# Patient Record
Sex: Male | Born: 1975
Health system: Southern US, Community
[De-identification: ages and names within clinical notes are randomized; demographics above are authoritative.]

## PROBLEM LIST (undated history)

## (undated) DIAGNOSIS — M8569 Other cyst of bone, multiple sites: Secondary | ICD-10-CM

## (undated) DIAGNOSIS — K921 Melena: Secondary | ICD-10-CM

## (undated) DIAGNOSIS — N200 Calculus of kidney: Secondary | ICD-10-CM

## (undated) DIAGNOSIS — B181 Chronic viral hepatitis B without delta-agent: Principal | ICD-10-CM

## (undated) DIAGNOSIS — N289 Disorder of kidney and ureter, unspecified: Secondary | ICD-10-CM

## (undated) DIAGNOSIS — K2950 Unspecified chronic gastritis without bleeding: Secondary | ICD-10-CM

## (undated) DIAGNOSIS — K5900 Constipation, unspecified: Secondary | ICD-10-CM

## (undated) DIAGNOSIS — R17 Unspecified jaundice: Secondary | ICD-10-CM

## (undated) DIAGNOSIS — K219 Gastro-esophageal reflux disease without esophagitis: Secondary | ICD-10-CM

## (undated) DIAGNOSIS — R809 Proteinuria, unspecified: Secondary | ICD-10-CM

## (undated) DIAGNOSIS — R739 Hyperglycemia, unspecified: Secondary | ICD-10-CM

## (undated) HISTORY — DX: Unspecified chronic gastritis without bleeding: K29.50

## (undated) HISTORY — DX: Disorder of kidney and ureter, unspecified: N28.9

## (undated) HISTORY — DX: Melena: K92.1

## (undated) HISTORY — DX: Unspecified jaundice: R17

## (undated) HISTORY — DX: Chronic viral hepatitis B without delta-agent: B18.1

## (undated) HISTORY — DX: Calculus of kidney: N20.0

## (undated) HISTORY — DX: Proteinuria, unspecified: R80.9

## (undated) HISTORY — DX: Other cyst of bone, multiple sites: M85.69

## (undated) HISTORY — DX: Gastro-esophageal reflux disease without esophagitis: K21.9

## (undated) HISTORY — DX: Constipation, unspecified: K59.00

## (undated) HISTORY — PX: EXTRACORPOREAL SHOCK WAVE LITHOTRIPSY: SHX1557

## (undated) HISTORY — DX: Hyperglycemia, unspecified: R73.9

---

## 2002-02-01 DIAGNOSIS — N200 Calculus of kidney: Secondary | ICD-10-CM

## 2002-02-01 HISTORY — DX: Calculus of kidney: N20.0

## 2010-02-01 DIAGNOSIS — B181 Chronic viral hepatitis B without delta-agent: Secondary | ICD-10-CM

## 2010-02-01 DIAGNOSIS — B1910 Unspecified viral hepatitis B without hepatic coma: Secondary | ICD-10-CM | POA: Insufficient documentation

## 2010-02-01 HISTORY — DX: Chronic viral hepatitis B without delta-agent: B18.1

## 2011-03-16 HISTORY — PX: CIRCUMCISION: SUR203

## 2011-07-19 HISTORY — PX: ESOPHAGOGASTRODUODENOSCOPY: SHX1529

## 2013-02-01 DIAGNOSIS — N289 Disorder of kidney and ureter, unspecified: Secondary | ICD-10-CM

## 2013-02-01 DIAGNOSIS — R809 Proteinuria, unspecified: Secondary | ICD-10-CM

## 2013-02-01 HISTORY — DX: Proteinuria, unspecified: R80.9

## 2013-02-01 HISTORY — DX: Disorder of kidney and ureter, unspecified: N28.9

## 2013-07-20 LAB — LIPID PANEL
Cholesterol: 167 (ref 0–200)
HDL: 52 (ref 35–70)
LDL Cholesterol: 85
Triglycerides: 148 (ref 40–160)

## 2013-07-20 LAB — BASIC METABOLIC PANEL
BUN: 14 (ref 4–21)
Creatinine: 1 (ref <=1.3)
Glucose: 89
Potassium: 4.3 (ref 3.4–5.3)
Sodium: 140 (ref 137–147)

## 2013-07-20 LAB — CBC AND DIFFERENTIAL
HCT: 93 — AB (ref 41–53)
Hemoglobin: 14.1 (ref 13.5–17.5)
Neutrophils Absolute: 3360
Platelets: 267 (ref 150–399)
WBC: 6.5

## 2013-07-20 LAB — HEPATIC FUNCTION PANEL
ALT: 21 (ref 10–40)
AST: 18 (ref 14–40)
Alkaline Phosphatase: 78 (ref 25–125)

## 2013-07-20 LAB — HEMOGLOBIN A1C: Hemoglobin A1C: 5.7

## 2013-08-15 LAB — HEPATIC FUNCTION PANEL
Bilirubin, Direct: 0.2 (ref 0.01–0.4)
Bilirubin, Total: 1.3

## 2016-06-18 ENCOUNTER — Encounter: Payer: Self-pay | Admitting: Family Medicine

## 2016-06-18 ENCOUNTER — Ambulatory Visit (INDEPENDENT_AMBULATORY_CARE_PROVIDER_SITE_OTHER): Payer: BLUE CROSS/BLUE SHIELD | Admitting: Family Medicine

## 2016-06-18 VITALS — BP 104/71 | HR 76 | Temp 98.0°F | Resp 16 | Ht 63.5 in | Wt 153.2 lb

## 2016-06-18 DIAGNOSIS — B9789 Other viral agents as the cause of diseases classified elsewhere: Secondary | ICD-10-CM | POA: Diagnosis not present

## 2016-06-18 DIAGNOSIS — J301 Allergic rhinitis due to pollen: Secondary | ICD-10-CM | POA: Diagnosis not present

## 2016-06-18 DIAGNOSIS — J069 Acute upper respiratory infection, unspecified: Secondary | ICD-10-CM

## 2016-06-18 NOTE — Progress Notes (Signed)
Office Note 06/18/2016  CC:  Chief Complaint  Patient presents with  . Establish Care  . URI    x 2 weeks    HPI:  Kenneth Aguilar is a 41 y.o.  male who is here to establish care and discuss respiratory symptoms. Patient's most recent primary MD: moved to [place] 2 yrs ago and has not established a PCP until now. [facility] [place] [facility]he'll have to get info about the office/MD b/c he doesn't recall right now. Old records were not reviewed prior to or during today's visit.  About 2 wks ago he started having runny nose, sneezing, some dry cough, ST, HA, and mild generalized muscle aches.  He used some otc cough med but this didn't help much.  Sneezing got worse.  No fevers initially.  He thought it was allergic rhinitis so he bought some zyrtec and this didn't help and it made him drowsy.  Then he developed fever to 100.4 after >7d, for one night.  Since then he feels better.  Just some cough remaining.  No hx of seasonal allergic rhinitis until he moved to [place] 6 mo ago--sneezing a lot.    Past Medical History:  Diagnosis Date  . Hepatitis B 2012   Had Hep B vaccine in [place] when in HS.  Pt states labs showed he had hx of + hep B infection in 2012 but it was not active.  No treatment was recommended at that time.  . Kidney stones 2004    Past Surgical History:  Procedure Laterality Date  . EXTRACORPOREAL SHOCK WAVE LITHOTRIPSY      Family History  Problem Relation Age of Onset  . Hypertension Paternal Grandfather   . Stroke Paternal Grandfather     Social History   Social History  . Marital status: Married    Spouse name: N/A  . Number of children: N/A  . Years of education: N/A   Occupational History  . Not on file.   Social History Main Topics  . Smoking status: Former Smoker    Packs/day: 0.50    Years: 15.00    Types: Cigarettes    Quit date: 02/02/2012  . Smokeless tobacco: Never Used  . Alcohol use Yes     Comment: occasionally  . Drug use: No  . Sexual  activity: Not on file   Other Topics Concern  . Not on file   Social History Narrative   Married, 1 daughter and 1 son.   Educ: Ph.D.   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   Tobacco: 10 pack yr hx--quit 2014.   Alcohol: occasional.    Outpatient Encounter Prescriptions as of 06/18/2016  Medication Sig  . cetirizine (ZYRTEC) 10 MG tablet Take 10 mg by mouth daily.   No facility-administered encounter medications on file as of 06/18/2016.     No Known Allergies  ROS Review of Systems  Constitutional: Negative for fatigue and fever.  HENT:       See HPI  Eyes: Negative for visual disturbance.  Respiratory: Positive for cough (see hpi).   Cardiovascular: Negative for chest pain.  Gastrointestinal: Negative for abdominal pain and nausea.  Genitourinary: Negative for dysuria.  Musculoskeletal: Negative for back pain and joint swelling.  Skin: Negative for rash.  Neurological: Negative for weakness and headaches.  Hematological: Negative for adenopathy.    PE; Blood pressure 104/71, pulse 76, temperature 98 F (36.7 C), temperature source Oral, resp. rate 16, height 5' 3.5" (1.613 m),  weight 153 lb 4 oz (69.5 kg), SpO2 96 %. VS: noted--normal. Gen: alert, NAD, NONTOXIC APPEARING. HEENT: eyes without injection, drainage, or swelling.  Ears: EACs clear, TMs with normal light reflex and landmarks.  Nose: Clear rhinorrhea, with some dried, crusty exudate adherent to mildly injected mucosa.  No purulent d/c.  No paranasal sinus TTP.  No facial swelling.  Throat and mouth without focal lesion.  No pharyngial swelling, erythema, or exudate.   Neck: supple, no LAD.   LUNGS: CTA bilat, nonlabored resps.   CV: RRR, no m/r/g. EXT: no c/c/e SKIN: no rash  Pertinent labs:  None today  ASSESSMENT AND PLAN:   New pt: pt to work on getting old records: Pls try to locate the name of your MD or the medical office that you went to in [facility]. Call us with this info  and we'll get your old records.  1) Viral URI with cough: resolving appropriately.  May continue otc cough med prn.  2) Allergic rhinitis--seasonal: recommended trial of otc nasal steroid but he said he'll wait until next time he sees me to possibly get rx for this.  3) Hx of Hep B: history unclear from pt--need old records, plus I'll do some labs relative to this dx at his f/u for CPE in 2 wks.  An After Visit Summary was printed and given to the patient.  Return in about 2 weeks (around 07/02/2016) for annual CPE (fasting).   Signed:  Santiago Bumpers, MD           06/18/2016

## 2016-06-18 NOTE — Patient Instructions (Signed)
Pls try to locate the name of your MD or the medical office that you went [place] [facility] Call us with this info and we'll get your old records.

## 2016-07-01 ENCOUNTER — Encounter: Payer: BLUE CROSS/BLUE SHIELD | Admitting: Family Medicine

## 2016-07-12 ENCOUNTER — Encounter: Payer: Self-pay | Admitting: Family Medicine

## 2016-07-13 NOTE — Telephone Encounter (Signed)
Diane, please request records. Thanks.

## 2016-07-14 ENCOUNTER — Telehealth: Payer: Self-pay | Admitting: *Deleted

## 2016-07-14 ENCOUNTER — Encounter: Payer: Self-pay | Admitting: *Deleted

## 2016-07-14 ENCOUNTER — Encounter: Payer: Self-pay | Admitting: Family Medicine

## 2016-07-14 DIAGNOSIS — R739 Hyperglycemia, unspecified: Secondary | ICD-10-CM | POA: Insufficient documentation

## 2016-07-14 DIAGNOSIS — K2950 Unspecified chronic gastritis without bleeding: Secondary | ICD-10-CM | POA: Insufficient documentation

## 2016-07-14 DIAGNOSIS — K219 Gastro-esophageal reflux disease without esophagitis: Secondary | ICD-10-CM | POA: Insufficient documentation

## 2016-07-14 DIAGNOSIS — R17 Unspecified jaundice: Secondary | ICD-10-CM | POA: Insufficient documentation

## 2016-07-14 DIAGNOSIS — E785 Hyperlipidemia, unspecified: Secondary | ICD-10-CM | POA: Insufficient documentation

## 2016-07-14 NOTE — Telephone Encounter (Signed)
Received complete medical records from [facility] [phone]). Records put on Dr. Samul DadaMcGowen's desk for review.

## 2016-07-15 ENCOUNTER — Encounter: Payer: BLUE CROSS/BLUE SHIELD | Admitting: Family Medicine

## 2016-07-19 ENCOUNTER — Encounter: Payer: Self-pay | Admitting: Family Medicine

## 2016-07-20 NOTE — Telephone Encounter (Signed)
Reviewed records and updated EMR. 

## 2016-08-06 ENCOUNTER — Encounter: Payer: Self-pay | Admitting: *Deleted

## 2016-08-06 ENCOUNTER — Encounter: Payer: Self-pay | Admitting: Family Medicine

## 2016-08-06 ENCOUNTER — Telehealth: Payer: Self-pay | Admitting: *Deleted

## 2016-08-06 NOTE — Telephone Encounter (Signed)
Records from [facility] have been placed on Dr. Samul DadaMcGowen's desk for review.

## 2016-08-08 ENCOUNTER — Encounter: Payer: Self-pay | Admitting: Family Medicine

## 2016-08-18 ENCOUNTER — Other Ambulatory Visit: Payer: Self-pay | Admitting: Family Medicine

## 2016-08-18 ENCOUNTER — Ambulatory Visit (HOSPITAL_BASED_OUTPATIENT_CLINIC_OR_DEPARTMENT_OTHER)
Admission: RE | Admit: 2016-08-18 | Discharge: 2016-08-18 | Disposition: A | Discharge status: Home / Self Care | Payer: BLUE CROSS/BLUE SHIELD | Source: Ambulatory Visit | Attending: Family Medicine | Admitting: Family Medicine

## 2016-08-18 ENCOUNTER — Ambulatory Visit (INDEPENDENT_AMBULATORY_CARE_PROVIDER_SITE_OTHER): Payer: BLUE CROSS/BLUE SHIELD | Admitting: Family Medicine

## 2016-08-18 ENCOUNTER — Encounter: Payer: Self-pay | Admitting: Family Medicine

## 2016-08-18 VITALS — BP 113/73 | HR 66 | Temp 97.9°F | Resp 16 | Ht 63.5 in | Wt 155.0 lb

## 2016-08-18 DIAGNOSIS — Z0000 Encounter for general adult medical examination without abnormal findings: Secondary | ICD-10-CM | POA: Diagnosis not present

## 2016-08-18 DIAGNOSIS — Z87448 Personal history of other diseases of urinary system: Secondary | ICD-10-CM | POA: Diagnosis not present

## 2016-08-18 DIAGNOSIS — K829 Disease of gallbladder, unspecified: Secondary | ICD-10-CM | POA: Insufficient documentation

## 2016-08-18 DIAGNOSIS — B181 Chronic viral hepatitis B without delta-agent: Secondary | ICD-10-CM | POA: Diagnosis not present

## 2016-08-18 DIAGNOSIS — N2889 Other specified disorders of kidney and ureter: Secondary | ICD-10-CM | POA: Diagnosis not present

## 2016-08-18 DIAGNOSIS — Z114 Encounter for screening for human immunodeficiency virus [HIV]: Secondary | ICD-10-CM

## 2016-08-18 DIAGNOSIS — N200 Calculus of kidney: Secondary | ICD-10-CM | POA: Insufficient documentation

## 2016-08-18 LAB — CBC WITH DIFFERENTIAL/PLATELET
Basophils Absolute: 0 10*3/uL (ref 0.0–0.1)
Basophils Relative: 0.7 % (ref 0.0–3.0)
Eosinophils Absolute: 0.1 10*3/uL (ref 0.0–0.7)
Eosinophils Relative: 1.2 % (ref 0.0–5.0)
HCT: 43.1 % (ref 39.0–52.0)
Hemoglobin: 14.2 g/dL (ref 13.0–17.0)
Lymphocytes Relative: 38 % (ref 12.0–46.0)
Lymphs Abs: 2.1 10*3/uL (ref 0.7–4.0)
MCHC: 33 g/dL (ref 30.0–36.0)
MCV: 92.2 fl (ref 78.0–100.0)
Monocytes Absolute: 0.4 10*3/uL (ref 0.1–1.0)
Monocytes Relative: 7.1 % (ref 3.0–12.0)
Neutro Abs: 3 10*3/uL (ref 1.4–7.7)
Neutrophils Relative %: 53 % (ref 43.0–77.0)
Platelets: 277 10*3/uL (ref 150.0–400.0)
RBC: 4.67 Mil/uL (ref 4.22–5.81)
RDW: 13.9 % (ref 11.5–15.5)
WBC: 5.6 10*3/uL (ref 4.0–10.5)

## 2016-08-18 LAB — TSH: TSH: 1.35 u[IU]/mL (ref 0.35–4.50)

## 2016-08-18 LAB — LIPID PANEL
Cholesterol: 156 mg/dL (ref 0–200)
HDL: 58.5 mg/dL (ref >39.00)
LDL Cholesterol: 77 mg/dL (ref 0–99)
NonHDL: 97.09
Total CHOL/HDL Ratio: 3
Triglycerides: 99 mg/dL (ref 0.0–149.0)
VLDL: 19.8 mg/dL (ref 0.0–40.0)

## 2016-08-18 LAB — COMPREHENSIVE METABOLIC PANEL
ALT: 22 U/L (ref 0–53)
AST: 19 U/L (ref 0–37)
Albumin: 4.5 g/dL (ref 3.5–5.2)
Alkaline Phosphatase: 108 U/L (ref 39–117)
BUN: 10 mg/dL (ref 6–23)
CO2: 30 mEq/L (ref 19–32)
Calcium: 9.5 mg/dL (ref 8.4–10.5)
Chloride: 104 mEq/L (ref 96–112)
Creatinine, Ser: 0.9 mg/dL (ref 0.40–1.50)
GFR: 99.5 mL/min (ref >60.00)
Glucose, Bld: 90 mg/dL (ref 70–99)
Potassium: 4.1 mEq/L (ref 3.5–5.1)
Sodium: 139 mEq/L (ref 135–145)
Total Bilirubin: 1 mg/dL (ref 0.2–1.2)
Total Protein: 7.1 g/dL (ref 6.0–8.3)

## 2016-08-18 LAB — MICROALBUMIN / CREATININE URINE RATIO
Creatinine,U: 106.3 mg/dL
Microalb Creat Ratio: 18.8 mg/g (ref 0.0–30.0)
Microalb, Ur: 20 mg/dL — ABNORMAL HIGH (ref 0.0–1.9)

## 2016-08-18 LAB — GAMMA GT: GGT: 28 U/L (ref 7–51)

## 2016-08-18 NOTE — Patient Instructions (Signed)

## 2016-08-18 NOTE — Progress Notes (Signed)
Office Note 08/18/2016  CC:  Chief Complaint  Patient presents with  . Annual Exam    Pt is fasting.     HPI:  Kenneth Aguilar is a 41 y.o. Asian male who is here for annual health maintenance exam.  Eye exam and dental preventatives not UTD. Exercise: none Diet: not working on anything in particular--admits to eating lots of carbs.  Hx of Hep B infection.  As of 2015 labs at previous PCPs office, his labs showed Hep B carrier status, normal liver on u/s at that time.  He denies any abd pain, fatigue, jaundice, lack of appetite, abd fullness, LE swelling, or n/v/d.  Pt also notes past hx of proteinuria but I can't find record of this in his prior PCP records. Pt states he was told to go to a nephrologist but says he never did this.  Past Medical History:  Diagnosis Date  . Black stool   . Chronic gastritis    Hx of + H. pylori  . Constipation   . Cyst, bone local   . GERD (gastroesophageal reflux disease)   . Hepatitis B carrier [facility]) 2012   Labs showed he had hx of + hep B infection in 2015 but it was not active.  Imaging showed no liver abnormality.  No treatment was recommended at that time (review of old records shows dx of Hep B carrier))  . Hyperglycemia   . Jaundice   . Kidney stones 2004  . Lesion of right native kidney 08/2013   Very small angiomyolipoma (5 mm)  . Nephrolithiasis   . Protein in urine 2015   nephrologist eval recommended by prior PCP  . Total bilirubin, elevated    08/21/13 abd u/s normal.    Past Surgical History:  Procedure Laterality Date  . CIRCUMCISION  03/16/2011   Dx reason: Phimosis  . ESOPHAGOGASTRODUODENOSCOPY  07/19/2011  . EXTRACORPOREAL SHOCK WAVE LITHOTRIPSY      Family History  Problem Relation Age of Onset  . Hypertension Paternal Grandfather   . Stroke Paternal Grandfather     Social History   Social History  . Marital status: Married    Spouse name: N/A  . Number of children: N/A  . Years of education: N/A    Occupational History  . Not on file.   Social History Main Topics  . Smoking status: Former Smoker    Packs/day: 0.50    Years: 15.00    Types: Cigarettes    Quit date: 02/02/2012  . Smokeless tobacco: Never Used  . Alcohol use Yes     Comment: occasionally  . Drug use: No  . Sexual activity: Not on file   Other Topics Concern  . Not on file   Social History Narrative   Married, 1 daughter and 1 son.   Educ: Ph.D.   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   Tobacco: 10 pack yr hx--quit 2014.   Alcohol: occasional.    Outpatient Medications Prior to Visit  Medication Sig Dispense Refill  . cetirizine (ZYRTEC) 10 MG tablet Take 10 mg by mouth daily.     No facility-administered medications prior to visit.     Allergies  Allergen Reactions  . Tdap [Diphth-Acell Pertussis-Tetanus] Other (See Comments)    fever    ROS Review of Systems  Constitutional: Negative for appetite change, chills, fatigue and fever.  HENT: Negative for congestion, dental problem, ear pain and sore throat.   Eyes: Negative for discharge, redness and visual disturbance.  Respiratory: Negative for cough, chest tightness, shortness of breath and wheezing.   Cardiovascular: Negative for chest pain, palpitations and leg swelling.  Gastrointestinal: Negative for abdominal pain, blood in stool, diarrhea, nausea and vomiting.  Genitourinary: Negative for difficulty urinating, dysuria, flank pain, frequency, hematuria and urgency.  Musculoskeletal: Negative for arthralgias, back pain, joint swelling, myalgias and neck stiffness.  Skin: Negative for pallor and rash.  Neurological: Negative for dizziness, speech difficulty, weakness and headaches.  Hematological: Negative for adenopathy. Does not bruise/bleed easily.  Psychiatric/Behavioral: Negative for confusion and sleep disturbance. The patient is not nervous/anxious.     PE; Blood pressure 113/73, pulse 66, temperature 97.9 F (36.6  C), temperature source Oral, resp. rate 16, height 5' 3.5" (1.613 m), weight 155 lb (70.3 kg), SpO2 96 %. Body mass index is 27.03 kg/m.  Gen: Alert, well appearing.  Patient is oriented to person, place, time, and situation. AFFECT: pleasant, lucid thought and speech. ENT: Ears: EACs clear, normal epithelium.  TMs with good light reflex and landmarks bilaterally.  Eyes: no injection, icteris, swelling, or exudate.  EOMI, PERRLA. Nose: no drainage or turbinate edema/swelling.  No injection or focal lesion.  Mouth: lips without lesion/swelling.  Oral mucosa pink and moist.  Dentition intact and without obvious caries or gingival swelling.  Oropharynx without erythema, exudate, or swelling.  Neck: supple/nontender.  No LAD, mass, or TM.   CV: RRR, no m/r/g.   LUNGS: CTA bilat, nonlabored resps, good aeration in all lung fields. ABD: soft, NT, ND, BS normal.  No hepatospenomegaly or mass.  No bruits. EXT: no clubbing, cyanosis, or edema.  Musculoskeletal: no joint swelling, erythema, warmth, or tenderness.  ROM of all joints intact. Skin - no sores or suspicious lesions or rashes or color changes   Pertinent labs:   Lab Results  Component Value Date   WBC 6.5 07/20/2013   HGB 14.1 07/20/2013   HCT 93 (A) 07/20/2013   PLT 267 07/20/2013   Lab Results  Component Value Date   CREATININE 1.0 07/20/2013   BUN 14 07/20/2013   NA 140 07/20/2013   K 4.3 07/20/2013   Lab Results  Component Value Date   ALT 21 07/20/2013   AST 18 07/20/2013   ALKPHOS 78 07/20/2013   Lab Results  Component Value Date   CHOL 167 07/20/2013   Lab Results  Component Value Date   HDL 52 07/20/2013   Lab Results  Component Value Date   LDLCALC 85 07/20/2013   Lab Results  Component Value Date   TRIG 148 07/20/2013   Lab Results  Component Value Date   HGBA1C 5.7 07/20/2013    ASSESSMENT AND PLAN:   1) Hep B carrier: recheck lab panel for this condition as well as abdominal ultrasound. May  want to consider referral to hepatology clinic in [place].  2) Questionable hx of proteinuria syndrome: renal function has been normal. Will check urine microalb/cr today.  3) Health maintenance exam: Reviewed age and gender appropriate health maintenance issues (prudent diet, regular exercise, health risks of tobacco and excessive alcohol, use of seatbelts, fire alarms in home, use of sunscreen).  Also reviewed age and gender appropriate health screening as well as vaccine recommendations. Vaccines UTD. Fasting HP labs today.  An After Visit Summary was printed and given to the patient.  FOLLOW UP:  Return in about 1 year (around 08/18/2017) for annual CPE (fasting).  Signed:  Santiago Bumpers, MD  08/18/2016  

## 2016-08-19 ENCOUNTER — Encounter: Payer: Self-pay | Admitting: Family Medicine

## 2016-08-19 LAB — HEPATITIS B SURF AG CONFIRMATION: Hepatitis B Surf Ag Confirmation: POSITIVE — AB

## 2016-08-19 LAB — HEPATITIS B SURFACE ANTIBODY, QUANTITATIVE: Hepatitis B-Post: 7 m[IU]/mL — ABNORMAL LOW (ref >=10)

## 2016-08-19 LAB — HEPATITIS B SURFACE ANTIGEN: Hepatitis B Surface Ag: POSITIVE — AB

## 2016-08-19 LAB — HEPATITIS C ANTIBODY: HCV Ab: NEGATIVE

## 2016-08-19 LAB — HIV ANTIBODY (ROUTINE TESTING W REFLEX): HIV 1&2 Ab, 4th Generation: NONREACTIVE

## 2016-08-20 LAB — HEPATITIS B E ANTIGEN: Hepatitis Be Antigen: NONREACTIVE

## 2016-08-20 LAB — HEPATITIS B E ANTIBODY: Hepatitis Be Antibody: REACTIVE — AB

## 2016-08-20 LAB — HEPATITIS B CORE AB W/REFLEX: Hep B Core Total Ab: POSITIVE — AB

## 2016-08-20 LAB — HBCIGM: Hep B C IgM: NEGATIVE

## 2016-08-23 ENCOUNTER — Telehealth: Payer: Self-pay | Admitting: *Deleted

## 2016-08-23 NOTE — Telephone Encounter (Signed)
Pt called stating that Dr. Milinda CaveMcGowen left a vm for him to call back.

## 2016-08-24 ENCOUNTER — Encounter: Payer: Self-pay | Admitting: Family Medicine

## 2016-08-27 ENCOUNTER — Encounter: Payer: Self-pay | Admitting: *Deleted

## 2016-08-27 LAB — HEPATITIS B DNA, ULTRAQUANTITATIVE, PCR
Hepatitis B DNA (Calc): 1.68 Log IU/mL — ABNORMAL HIGH
Hepatitis B DNA: 47 IU/mL — ABNORMAL HIGH

## 2016-09-23 ENCOUNTER — Encounter: Payer: Self-pay | Admitting: Family Medicine

## 2017-11-17 ENCOUNTER — Ambulatory Visit (INDEPENDENT_AMBULATORY_CARE_PROVIDER_SITE_OTHER): Payer: BLUE CROSS/BLUE SHIELD | Admitting: Family Medicine

## 2017-11-17 ENCOUNTER — Encounter: Payer: Self-pay | Admitting: Family Medicine

## 2017-11-17 VITALS — BP 128/79 | HR 87 | Temp 98.7°F | Resp 16 | Ht 64.0 in | Wt 158.4 lb

## 2017-11-17 DIAGNOSIS — Z0000 Encounter for general adult medical examination without abnormal findings: Secondary | ICD-10-CM

## 2017-11-17 DIAGNOSIS — L989 Disorder of the skin and subcutaneous tissue, unspecified: Secondary | ICD-10-CM | POA: Diagnosis not present

## 2017-11-17 DIAGNOSIS — N2889 Other specified disorders of kidney and ureter: Secondary | ICD-10-CM | POA: Diagnosis not present

## 2017-11-17 DIAGNOSIS — B181 Chronic viral hepatitis B without delta-agent: Secondary | ICD-10-CM | POA: Diagnosis not present

## 2017-11-17 DIAGNOSIS — E663 Overweight: Secondary | ICD-10-CM

## 2017-11-17 NOTE — Patient Instructions (Signed)

## 2017-11-17 NOTE — Progress Notes (Signed)
Office Note 11/17/2017  CC:  Chief Complaint  Patient presents with  . Annual Exam    Pt is not fasting.   . Skin Lesion    upper left arm    HPI:  Kenneth Aguilar is a 42 y.o. Asian male who is here for annual health maintenance exam.  Says he is feeling well.  His only acute complaint is a skin lesion on left upper arm for the last few months.  It has grown, seems to have been irritated and bled easily.  Now it is pinkish and flaky.  No pain or itching.  He is a hep b carrier.  Due to poor communication, we never got him in with a hepatologist or ID specialist after I saw him last summer.  He is agreeable to this referral today.  Will also do rechecking of Hep B surface antigen and hep B DNA quant and LFTs. Also, we discussed the small right kidney lesion noted on abd u/s last year.  We had discussed doing the MRI abd that was recommended as f/u imaging to clarify if it is a angiomyolipoma, but it seems he was lost to f/u regarding this. He is open to scheduling this now.  Exercise: minimal Diet: "too much carbohydrates"    Past Medical History:  Diagnosis Date  . Black stool   . Chronic gastritis    Hx of + H. pylori  . Constipation   . Cyst, bone local   . GERD (gastroesophageal reflux disease)   . Hepatitis B carrier [facility]) 2012   Labs showed he had hx of + hep B infection in 2015 but it was not active.  Abd u/s showed no liver abnormality.  No treatment was recommended at that time (review of old records shows dx of Hep B carrier))  . Hyperglycemia   . Jaundice   . Kidney stones 2004  . Lesion of right native kidney 08/2013   U/S abd showed very small angiomyolipoma (5 mm) vs nonshadowing stone.  No hydronephrosis bilat.  Liver normal.  Rpt u/s 08/2016, same lesion measured 7 mm, MRI abd recommended by radiologist.  . Nephrolithiasis   . Protein in urine 2015   nephrologist eval recommended by prior PCP  . Total bilirubin, elevated    08/21/13 abd u/s normal.    Past  Surgical History:  Procedure Laterality Date  . CIRCUMCISION  03/16/2011   Dx reason: Phimosis  . ESOPHAGOGASTRODUODENOSCOPY  07/19/2011   Gastric bx: severe chronic gastritis, H pylori NEG  . EXTRACORPOREAL SHOCK WAVE LITHOTRIPSY      Family History  Problem Relation Age of Onset  . Hypertension Paternal Grandfather   . Stroke Paternal Grandfather     Social History   Socioeconomic History  . Marital status: Married    Spouse name: Not on file  . Number of children: Not on file  . Years of education: Not on file  . Highest education level: Not on file  Occupational History  . Not on file  Social Needs  . Financial resource strain: Not on file  . Food insecurity:    Worry: Not on file    Inability: Not on file  . Transportation needs:    Medical: Not on file    Non-medical: Not on file  Tobacco Use  . Smoking status: Former Smoker    Packs/day: 0.50    Years: 15.00    Pack years: 7.50    Types: Cigarettes    Last attempt to quit:  02/02/2012    Years since quitting: 5.7  . Smokeless tobacco: Never Used  Substance and Sexual Activity  . Alcohol use: Yes    Comment: occasionally  . Drug use: No  . Sexual activity: Not on file  Lifestyle  . Physical activity:    Days per week: Not on file    Minutes per session: Not on file  . Stress: Not on file  Relationships  . Social connections:    Talks on phone: Not on file    Gets together: Not on file    Attends religious service: Not on file    Active member of club or organization: Not on file    Attends meetings of clubs or organizations: Not on file    Relationship status: Not on file  . Intimate partner violence:    Fear of current or ex partner: Not on file    Emotionally abused: Not on file    Physically abused: Not on file    Forced sexual activity: Not on file  Other Topics Concern  . Not on file  Social History Narrative   Married, 1 daughter and 1 son.   Educ: Ph.D.   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   Tobacco: 10 pack yr hx--quit 2014.   Alcohol: occasional.    No outpatient medications prior to visit.   No facility-administered medications prior to visit.     Allergies  Allergen Reactions  . Tdap [Tetanus-Diphth-Acell Pertussis] Other (See Comments)    fever    ROS Review of Systems  Constitutional: Negative for appetite change, chills, fatigue and fever.  HENT: Negative for congestion, dental problem, ear pain and sore throat.   Eyes: Negative for discharge, redness and visual disturbance.  Respiratory: Negative for cough, chest tightness, shortness of breath and wheezing.   Cardiovascular: Negative for chest pain, palpitations and leg swelling.  Gastrointestinal: Negative for abdominal pain, blood in stool, diarrhea, nausea and vomiting.  Genitourinary: Negative for difficulty urinating, dysuria, flank pain, frequency, hematuria and urgency.  Musculoskeletal: Negative for arthralgias, back pain, joint swelling, myalgias and neck stiffness.  Skin: Negative for pallor and rash.       L upper arm lesion as noted in HPI  Neurological: Negative for dizziness, speech difficulty, weakness and headaches.  Hematological: Negative for adenopathy. Does not bruise/bleed easily.  Psychiatric/Behavioral: Negative for confusion and sleep disturbance. The patient is not nervous/anxious.     PE; Blood pressure 128/79, pulse 87, temperature 98.7 F (37.1 C), temperature source Oral, resp. rate 16, height 5\' 4"  (1.626 m), weight 158 lb 6 oz (71.8 kg), SpO2 96 %. Gen: Alert, well appearing.  Patient is oriented to person, place, time, and situation. AFFECT: pleasant, lucid thought and speech. ENT: Ears: EACs clear, normal epithelium.  TMs with good light reflex and landmarks bilaterally.  Eyes: no injection, icteris, swelling, or exudate.  EOMI, PERRLA. Nose: no drainage or turbinate edema/swelling.  No injection or focal lesion.  Mouth: lips without lesion/swelling.   Oral mucosa pink and moist.  Dentition intact and without obvious caries or gingival swelling.  Oropharynx without erythema, exudate, or swelling.  Neck: supple/nontender.  No LAD, mass, or TM.  Carotid pulses 2+ bilaterally, without bruits. CV: RRR, no m/r/g.   LUNGS: CTA bilat, nonlabored resps, good aeration in all lung fields. ABD: soft, NT, ND, BS normal.  No hepatospenomegaly or mass.  No bruits. EXT: no clubbing, cyanosis, or edema.  Musculoskeletal: no joint swelling, erythema, warmth, or tenderness.  ROM of all joints intact. Skin - no sores or color changes.  No jaundice.  Left upper arm extensor surface with 1 cm round, pink nodule with superficial desquamation and a bit of abrasion medially.  No warty appearance to the surface of the lesion, but it does appear irritated. No brown/dark pigment at all.   Pertinent labs:  Lab Results  Component Value Date   TSH 1.35 08/18/2016   Lab Results  Component Value Date   WBC 5.6 08/18/2016   HGB 14.2 08/18/2016   HCT 43.1 08/18/2016   MCV 92.2 08/18/2016   PLT 277.0 08/18/2016   Lab Results  Component Value Date   CREATININE 0.90 08/18/2016   BUN 10 08/18/2016   NA 139 08/18/2016   K 4.1 08/18/2016   CL 104 08/18/2016   CO2 30 08/18/2016   Lab Results  Component Value Date   ALT 22 08/18/2016   AST 19 08/18/2016   ALKPHOS 108 08/18/2016   BILITOT 1.0 08/18/2016   Lab Results  Component Value Date   CHOL 156 08/18/2016   Lab Results  Component Value Date   HDL 58.50 08/18/2016   Lab Results  Component Value Date   LDLCALC 77 08/18/2016   Lab Results  Component Value Date   TRIG 99.0 08/18/2016   Lab Results  Component Value Date   CHOLHDL 3 08/18/2016   Lab Results  Component Value Date   HGBA1C 5.7 07/20/2013    ASSESSMENT AND PLAN:   1) Health maintenance exam: Reviewed age and gender appropriate health maintenance issues (prudent diet, regular exercise, health risks of tobacco and excessive  alcohol, use of seatbelts, fire alarms in home, use of sunscreen).  Also reviewed age and gender appropriate health screening as well as vaccine recommendations. Vaccines: he is UTD, including this season's flu vaccine. Labs: future fasting HP labs ordered, as well as Hep B DNA pcr-quant and Hep B surface antigen recheck. Prostate ca screening: average risk patient= as per latest guidelines, start screening at 45-50 yrs of age. Colon ca screening: average risk patient= as per latest guidelines, start screening at 45-50 yrs of age.  2) Hep B carrier: refer to ID clinic. Recheck Hep B DNA PCR qualitative and Hep B surface antigen, + LFTs.  3) Right renal lesion: noted on abd u/s 1 yr ago. Ordered MRI abd with contrast to f/u as per radiologist's recommendations.  4) Left upper arm skin lesion: ? Irritated verruca?  Pt will schedule procedure visit with me and I'll do shave excision and send to path.  An After Visit Summary was printed and given to the patient.  FOLLOW UP:  Return for Appt at pt's convenience for procedure (skin lesion excision); also, fasting lab appt  ASAP.  Signed:  Santiago Bumpers, MD           11/17/2017

## 2017-11-21 ENCOUNTER — Encounter: Payer: BLUE CROSS/BLUE SHIELD | Admitting: Family Medicine

## 2017-11-21 ENCOUNTER — Other Ambulatory Visit (INDEPENDENT_AMBULATORY_CARE_PROVIDER_SITE_OTHER): Payer: BLUE CROSS/BLUE SHIELD

## 2017-11-21 DIAGNOSIS — Z0000 Encounter for general adult medical examination without abnormal findings: Secondary | ICD-10-CM

## 2017-11-21 DIAGNOSIS — B181 Chronic viral hepatitis B without delta-agent: Secondary | ICD-10-CM

## 2017-11-21 DIAGNOSIS — E663 Overweight: Secondary | ICD-10-CM | POA: Diagnosis not present

## 2017-11-21 LAB — COMPREHENSIVE METABOLIC PANEL
ALT: 25 U/L (ref 0–53)
AST: 19 U/L (ref 0–37)
Albumin: 4.5 g/dL (ref 3.5–5.2)
Alkaline Phosphatase: 83 U/L (ref 39–117)
BUN: 21 mg/dL (ref 6–23)
CO2: 27 mEq/L (ref 19–32)
Calcium: 9.1 mg/dL (ref 8.4–10.5)
Chloride: 106 mEq/L (ref 96–112)
Creatinine, Ser: 0.89 mg/dL (ref 0.40–1.50)
GFR: 100.16 mL/min (ref >60.00)
Glucose, Bld: 96 mg/dL (ref 70–99)
Potassium: 4.2 mEq/L (ref 3.5–5.1)
Sodium: 140 mEq/L (ref 135–145)
Total Bilirubin: 1 mg/dL (ref 0.2–1.2)
Total Protein: 7.2 g/dL (ref 6.0–8.3)

## 2017-11-21 LAB — CBC WITH DIFFERENTIAL/PLATELET
Basophils Absolute: 0 10*3/uL (ref 0.0–0.1)
Basophils Relative: 0.7 % (ref 0.0–3.0)
Eosinophils Absolute: 0.1 10*3/uL (ref 0.0–0.7)
Eosinophils Relative: 2.3 % (ref 0.0–5.0)
HCT: 43.2 % (ref 39.0–52.0)
Hemoglobin: 14.6 g/dL (ref 13.0–17.0)
Lymphocytes Relative: 33.7 % (ref 12.0–46.0)
Lymphs Abs: 2.1 10*3/uL (ref 0.7–4.0)
MCHC: 33.8 g/dL (ref 30.0–36.0)
MCV: 91.2 fl (ref 78.0–100.0)
Monocytes Absolute: 0.3 10*3/uL (ref 0.1–1.0)
Monocytes Relative: 5.3 % (ref 3.0–12.0)
Neutro Abs: 3.7 10*3/uL (ref 1.4–7.7)
Neutrophils Relative %: 58 % (ref 43.0–77.0)
Platelets: 288 10*3/uL (ref 150.0–400.0)
RBC: 4.74 Mil/uL (ref 4.22–5.81)
RDW: 13.1 % (ref 11.5–15.5)
WBC: 6.4 10*3/uL (ref 4.0–10.5)

## 2017-11-21 LAB — LIPID PANEL
Cholesterol: 197 mg/dL (ref 0–200)
HDL: 55.5 mg/dL (ref >39.00)
LDL Cholesterol: 125 mg/dL — ABNORMAL HIGH (ref 0–99)
NonHDL: 141.8
Total CHOL/HDL Ratio: 4
Triglycerides: 82 mg/dL (ref 0.0–149.0)
VLDL: 16.4 mg/dL (ref 0.0–40.0)

## 2017-11-21 LAB — TSH: TSH: 1.3 u[IU]/mL (ref 0.35–4.50)

## 2017-11-23 ENCOUNTER — Other Ambulatory Visit: Payer: Self-pay | Admitting: *Deleted

## 2017-11-23 ENCOUNTER — Encounter: Payer: Self-pay | Admitting: *Deleted

## 2017-11-23 DIAGNOSIS — N2889 Other specified disorders of kidney and ureter: Secondary | ICD-10-CM

## 2017-11-23 LAB — HEPATITIS B SURFACE ANTIGEN: Hepatitis B Surface Ag: NONREACTIVE

## 2017-11-23 LAB — HEPATITIS B DNA, ULTRAQUANTITATIVE, PCR
Hepatitis B DNA (Calc): 1.27 Log IU/mL — ABNORMAL HIGH
Hepatitis B DNA: 19 IU/mL — ABNORMAL HIGH

## 2017-11-28 ENCOUNTER — Telehealth: Payer: Self-pay | Admitting: *Deleted

## 2017-11-28 ENCOUNTER — Encounter: Payer: Self-pay | Admitting: Family Medicine

## 2017-11-28 ENCOUNTER — Ambulatory Visit (INDEPENDENT_AMBULATORY_CARE_PROVIDER_SITE_OTHER): Payer: BLUE CROSS/BLUE SHIELD | Admitting: Family Medicine

## 2017-11-28 VITALS — BP 121/78 | HR 70 | Temp 98.1°F | Resp 16 | Ht 64.0 in | Wt 155.1 lb

## 2017-11-28 DIAGNOSIS — L989 Disorder of the skin and subcutaneous tissue, unspecified: Secondary | ICD-10-CM

## 2017-11-28 NOTE — Telephone Encounter (Signed)
Patient called back. I gave him the message. He said that he would call [facility] to schedule MRI. Patient understood.

## 2017-11-28 NOTE — Telephone Encounter (Signed)
Left message for pt to call back.   Pt needs to call GI at [phone] to schedule him MRI.  Okay for PEC to advise pt.

## 2017-11-28 NOTE — Telephone Encounter (Signed)
Hi, Someone from [name] left a message for him to call back on 11/25/17.   Previous Messages    ----- Message -----  From: Kenneth Aguilar, CMA  Sent: 11/28/2017  2:18 PM EDT  To: Kenneth Aguilar  Subject: FW: MRI                      Will you please help me with this, I did not see any comments on the order so idk if anything has been done. Thanks.  ----- Message -----  From: Kenneth Massed, MD  Sent: 11/28/2017  1:30 PM EDT  To: Kenneth Aguilar, CMA  Subject: MRI                        I ordered MRI abd w and w/out contrast for pt last week.  He says he has not been contacted about scheduling this.  Can you check into it/ask Diane or Suandrea?-thx

## 2017-11-28 NOTE — Progress Notes (Signed)
OFFICE VISIT  11/28/2017   CC:  Chief Complaint  Patient presents with  . Procedure    skin lesion excision   HPI:    Patient is a 42 y.o. Asian male who presents for skin lesion removal from left upper arm. It is not pigmented. Plan is for shave excision and send to path today.  Past Medical History:  Diagnosis Date  . Black stool   . Chronic gastritis    Hx of + H. pylori  . Constipation   . Cyst, bone local   . GERD (gastroesophageal reflux disease)   . Hepatitis B carrier [facility]) 2012   Labs showed he had hx of + hep B infection in 2015 but it was not active.  Abd u/s showed no liver abnormality.  No treatment was recommended at that time (review of old records shows dx of Hep B carrier))  . Hyperglycemia   . Jaundice   . Kidney stones 2004  . Lesion of right native kidney 08/2013   U/S abd showed very small angiomyolipoma (5 mm) vs nonshadowing stone.  No hydronephrosis bilat.  Liver normal.  Rpt u/s 08/2016, same lesion measured 7 mm, MRI abd recommended by radiologist.  . Nephrolithiasis   . Protein in urine 2015   nephrologist eval recommended by prior PCP  . Total bilirubin, elevated    08/21/13 abd u/s normal.    Past Surgical History:  Procedure Laterality Date  . CIRCUMCISION  03/16/2011   Dx reason: Phimosis  . ESOPHAGOGASTRODUODENOSCOPY  07/19/2011   Gastric bx: severe chronic gastritis, H pylori NEG  . EXTRACORPOREAL SHOCK WAVE LITHOTRIPSY      No outpatient medications prior to visit.   No facility-administered medications prior to visit.     Allergies  Allergen Reactions  . Tdap [Tetanus-Diphth-Acell Pertussis] Other (See Comments)    fever    ROS As per HPI  PE: Blood pressure 121/78, pulse 70, temperature 98.1 F (36.7 C), temperature source Oral, resp. rate 16, height 5\' 4"  (1.626 m), weight 155 lb 2 oz (70.4 kg), SpO2 95 %. Left upper arm extensor surface with 1 cm round, pink nodule with superficial desquamation and a bit of abrasion  medially.  No warty appearance to the surface of the lesion, but it does appear irritated. No brown/dark pigment at all.  LABS:    Chemistry      Component Value Date/Time   NA 140 11/21/2017 0808   NA 140 07/20/2013   K 4.2 11/21/2017 0808   CL 106 11/21/2017 0808   CO2 27 11/21/2017 0808   BUN 21 11/21/2017 0808   BUN 14 07/20/2013   CREATININE 0.89 11/21/2017 0808   GLU 89 07/20/2013      Component Value Date/Time   CALCIUM 9.1 11/21/2017 0808   ALKPHOS 83 11/21/2017 0808   AST 19 11/21/2017 0808   ALT 25 11/21/2017 0808   BILITOT 1.0 11/21/2017 0808     Lab Results  Component Value Date   WBC 6.4 11/21/2017   HGB 14.6 11/21/2017   HCT 43.2 11/21/2017   MCV 91.2 11/21/2017   PLT 288.0 11/21/2017    IMPRESSION AND PLAN:  Left upper arm skin lesion: shave excision. Consent obtained. Assisted by Pryor Ochoa, CMA. Anesthetized area of lesion with 1 cc of 1% lidocaine with epi. Used sterile technique with dermablade to shave the entire lesion off surface of his skin. Small divot obtained, lesion put in specimen container and sent to pathology. Pt tolerated procedure well.  No immediate complications. Home dressing instructions given.  An After Visit Summary was printed and given to the patient.  FOLLOW UP: Return for as needed.  Signed:  Santiago Bumpers, MD           11/28/2017

## 2017-12-12 ENCOUNTER — Ambulatory Visit (INDEPENDENT_AMBULATORY_CARE_PROVIDER_SITE_OTHER): Payer: BLUE CROSS/BLUE SHIELD | Admitting: Internal Medicine

## 2017-12-12 ENCOUNTER — Encounter: Payer: Self-pay | Admitting: Internal Medicine

## 2017-12-12 DIAGNOSIS — B181 Chronic viral hepatitis B without delta-agent: Secondary | ICD-10-CM

## 2017-12-12 NOTE — Progress Notes (Addendum)
[facility]  Reason for Consult: Chronic hepatitis B Referring Provider: Dr. Nicoletta Ba  Assessment: Based on current guidelines he has no indication for starting treatment for hepatitis B at this time.  His liver enzymes have been normal, his viral load is extremely low and he has no evidence of cirrhosis.  I will have him follow-up after repeat blood work in 6 months.   Plan: 1. Await results of abdominal MRI 2. Follow-up here after lab work in 6 months  Addendum: Liver is within normal limits on MRI 12/19/2017.  Patient Active Problem List   Diagnosis Date Noted  . Hepatitis B 02/01/2010    Priority: High  . GERD (gastroesophageal reflux disease)   . Chronic gastritis   . Hyperglycemia   . Dyslipidemia   . Jaundice     Patient's Medications   No medications on file    HPI: Kenneth Aguilar is a 42 y.o. male who was born in [place].  He is a Radiographer, therapeutic.  He moved to [place] about 3 years ago.  He says that he was diagnosed with chronic hepatitis B in 2012.  He tells me he is not sure why he was tested at that time.  He cannot recall if he ever had clinical hepatitis or jaundice although jaundice is listed on his current problem list.  He has never been treated for hepatitis B.  Denies any known risk factors for hepatitis B.  He believes that his mother was screened and is negative.  He has never had a blood transfusion.  He is never injected drugs.  He is never had a tattoo or other injections.  He is currently married and has 2 young children.  His wife is negative for hepatitis B infection and has been vaccinated.  He does not know the status of other previous partners.  His liver enzymes have been normal.  He had a ultrasound of his abdomen in July of last year.  His liver appeared normal but he did have a 7 mm echogenic mass in the right kidney.  He has been scheduled for an MRI of his abdomen on 12/19/2017.  When screened last  year his hepatitis B e antigen was negative and he was e antibody positive.  His hepatitis B DNA viral load in July of last year was 47 international units/mL.  It was repeated last month and was even lower at 19 international units/mL.  Review of Systems: Review of Systems  Constitutional: Negative for chills, diaphoresis, fever, malaise/fatigue and weight loss.  Gastrointestinal: Negative for abdominal pain, diarrhea, nausea and vomiting.  Skin: Negative for itching and rash.      Past Medical History:  Diagnosis Date  . Black stool   . Chronic gastritis    Hx of + H. pylori  . Constipation   . Cyst, bone local   . GERD (gastroesophageal reflux disease)   . Hepatitis B carrier [facility]) 2012   Labs showed he had hx of + hep B infection in 2015 but it was not active.  Abd u/s showed no liver abnormality.  No treatment was recommended at that time (review of old records shows dx of Hep B carrier))  . Hyperglycemia   . Jaundice   . Kidney stones 2004  . Lesion of right native kidney 08/2013   U/S abd showed very small angiomyolipoma (5 mm) vs nonshadowing stone.  No hydronephrosis bilat.  Liver normal.  Rpt u/s 08/2016, same lesion measured 7 mm, MRI abd recommended by radiologist.  . Nephrolithiasis   . Protein in urine 2015   nephrologist eval recommended by prior PCP  . Total bilirubin, elevated    08/21/13 abd u/s normal.    Social History   Tobacco Use  . Smoking status: Former Smoker    Packs/day: 0.50    Years: 15.00    Pack years: 7.50    Types: Cigarettes    Last attempt to quit: 02/02/2012    Years since quitting: 5.8  . Smokeless tobacco: Never Used  Substance Use Topics  . Alcohol use: Yes    Comment: occasionally  . Drug use: No    Family History  Problem Relation Age of Onset  . Hypertension Paternal Grandfather   . Stroke Paternal Grandfather    Allergies  Allergen Reactions  . Tdap [Tetanus-Diphth-Acell Pertussis] Other (See Comments)    fever     OBJECTIVE: Vitals:   12/12/17 1525  BP: 131/89  Pulse: 69  Temp: 97.7 F (36.5 C)  TempSrc: Oral  Weight: 159 lb (72.1 kg)   Body mass index is 27.29 kg/m.   Physical Exam  Constitutional: He is oriented to person, place, and time.  He is in good spirits and in no distress.  HENT:  Mouth/Throat: No oropharyngeal exudate.  Cardiovascular: Normal rate, regular rhythm and normal heart sounds.  Pulmonary/Chest: Effort normal and breath sounds normal.  Abdominal: Soft. He exhibits no distension and no mass. There is no tenderness.  Neurological: He is alert and oriented to person, place, and time.  Skin: No rash noted.  Psychiatric: He has a normal mood and affect.    Microbiology: No results found for this or any previous visit (from the past 240 hour(s)).  Cliffton Asters, MD [facility] [phone] pager   [phone] cell 12/12/2017, 4:28 PM

## 2017-12-12 NOTE — Assessment & Plan Note (Signed)
Based on current guidelines he has no indication for starting treatment for hepatitis B at this time.  His liver enzymes have been normal, his viral load is extremely low and he has no evidence of cirrhosis.  I will have him follow-up after repeat blood work in 6 months.

## 2017-12-19 ENCOUNTER — Ambulatory Visit
Admission: RE | Admit: 2017-12-19 | Discharge: 2017-12-19 | Disposition: A | Discharge status: Home / Self Care | Payer: BLUE CROSS/BLUE SHIELD | Source: Ambulatory Visit | Attending: Family Medicine | Admitting: Family Medicine

## 2017-12-19 DIAGNOSIS — N2889 Other specified disorders of kidney and ureter: Secondary | ICD-10-CM

## 2017-12-19 MED ORDER — GADOBENATE DIMEGLUMINE 529 MG/ML IV SOLN
15.0000 mL | Freq: Once | INTRAVENOUS | Status: AC | PRN
Start: 2017-12-19 — End: 2017-12-19
  Administered 2017-12-19: 15 mL via INTRAVENOUS

## 2017-12-20 ENCOUNTER — Encounter: Payer: Self-pay | Admitting: Family Medicine

## 2018-05-30 ENCOUNTER — Other Ambulatory Visit: Payer: BLUE CROSS/BLUE SHIELD

## 2018-06-13 ENCOUNTER — Ambulatory Visit: Payer: BLUE CROSS/BLUE SHIELD | Admitting: Internal Medicine

## 2018-08-01 ENCOUNTER — Other Ambulatory Visit: Payer: Self-pay

## 2018-08-17 ENCOUNTER — Encounter: Payer: Self-pay | Admitting: Internal Medicine

## 2019-03-17 ENCOUNTER — Ambulatory Visit: Payer: Self-pay

## 2020-06-08 IMAGING — MR MR ABDOMEN WO/W CM
12 of 17 series · 29 of 48 positions shown · IV contrast (15ml Multihance)
Comparison: Abdominal ultrasound dated 08/18/2016

CLINICAL DATA: Renal lesion on ultrasound, hepatitis the

EXAM:
MRI ABDOMEN WITHOUT AND WITH CONTRAST
TECHNIQUE: Multiplanar multisequence MR imaging of the abdomen was performed
both before and after the administration of intravenous contrast.
CONTRAST:  15mL MULTIHANCE GADOBENATE DIMEGLUMINE 529 MG/ML IV SOLN

[Series 3: T2 · coronal · 5.0mm · 1.56mm/px · 1 of 32 slices shown (1 of 3)]
[im 1/32]
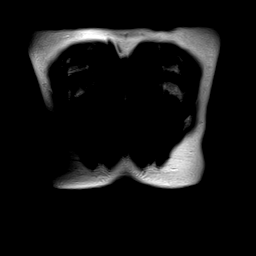

[Series 4: axial tru fisp · axial · 4.0mm · 1.41mm/px · 1 of 37 slices shown]
[im 1/37]
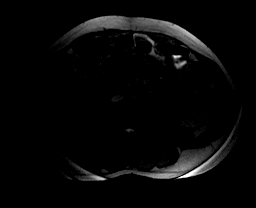

[Series 5: T2 · axial · 6.5mm · 0.70mm/px · 1 of 28 slices shown (2 of 3)]
[im 1/28]
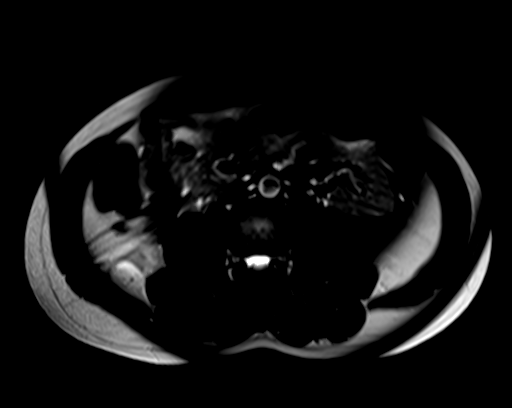

[Series 6: [id number] · axial · 6.0mm · 1.88mm/px · z∈[-106,+143]mm · 3 of 99 slices shown]
[im 1/99]
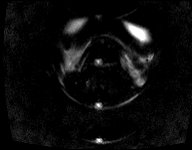
[im 50/99]
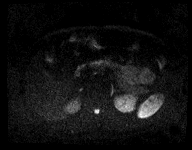
[im 99/99]
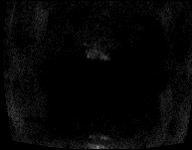

[Series 7: [id number]_adc · axial · 6.0mm · 1.88mm/px · 1 of 33 slices shown]
[im 1/33]
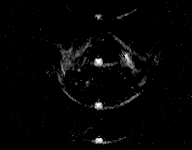

[Series 8: T2 · axial · 5.0mm · 1.37mm/px · 1 of 33 slices shown (3 of 3)]
[im 1/33]
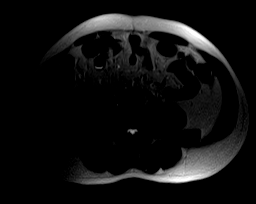

[Series 9: axial in out · axial · 5.5mm · 0.74mm/px · z∈[-103,+99]mm · 2 of 66 slices shown]
[im 1/66]
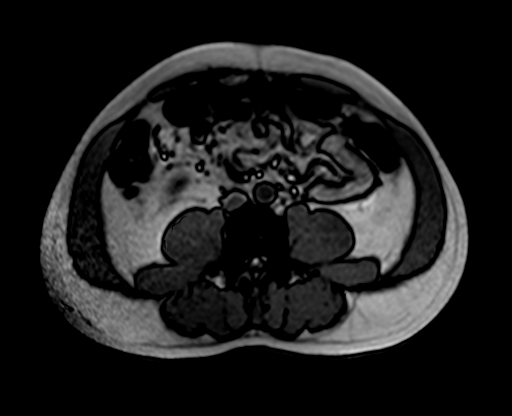
[im 66/66]
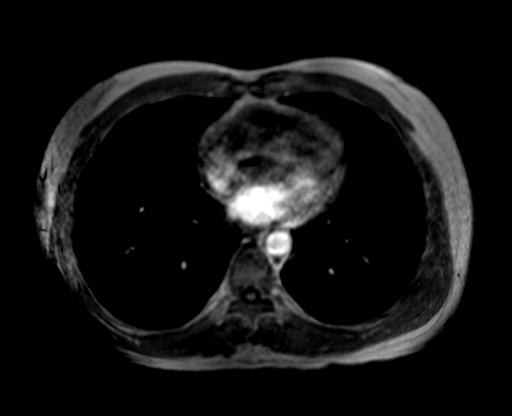

[Series 10: T1 dynamic · axial · non-contrast · 2.5mm · 0.78mm/px · z∈[-115,+102]mm · 3 of 88 slices shown]
[im 1/88]
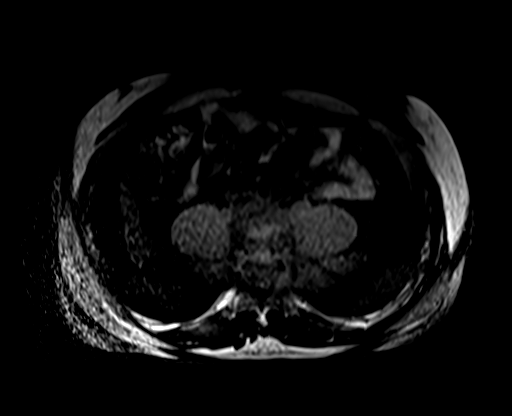
[im 44/88]
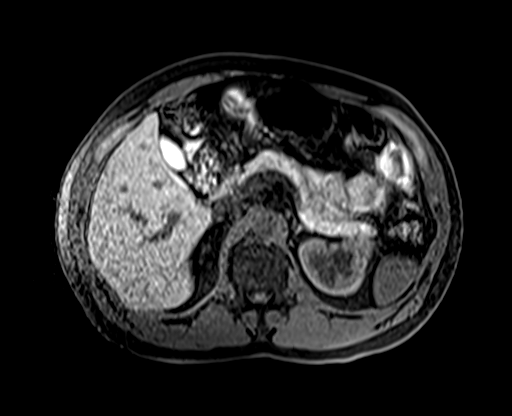
[im 88/88]
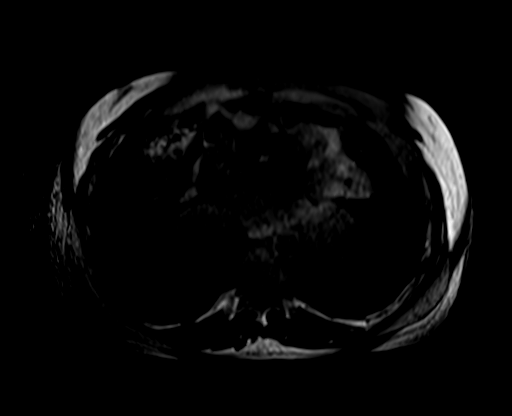

[Series 11: post 25 sec · axial · 2.5mm · 0.78mm/px · z∈[-115,+102]mm · 4 of 88 slices shown]
[im 1/88]
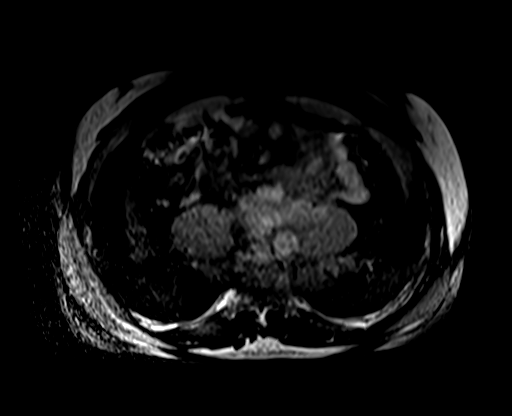
[im 30/88]
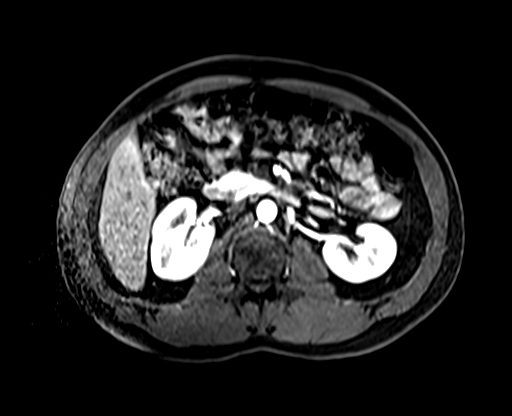
[im 59/88]
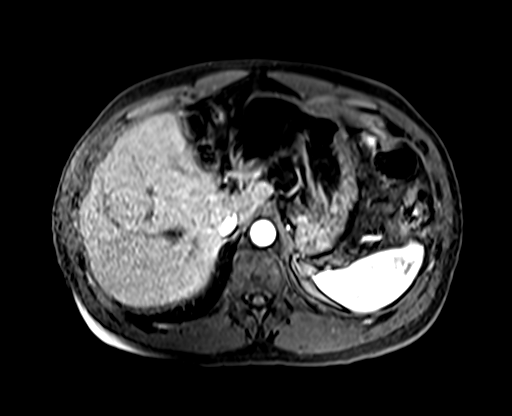
[im 88/88]
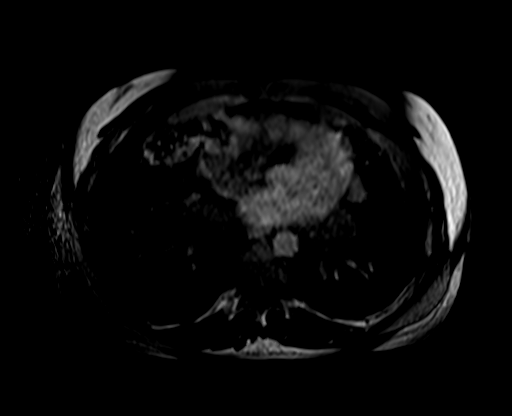

[Series 12: post 25 sec_sub · axial · 2.5mm · 0.78mm/px · z∈[-115,+102]mm · 4 of 88 slices shown]
[im 1/88]
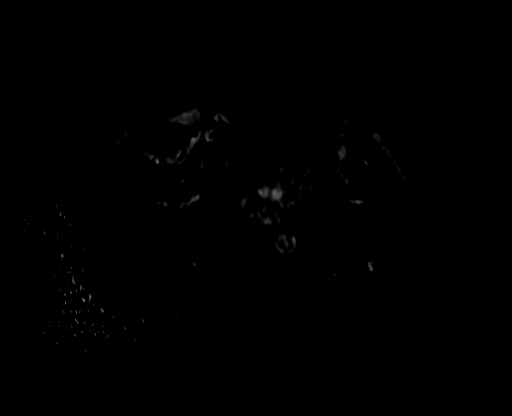
[im 30/88]
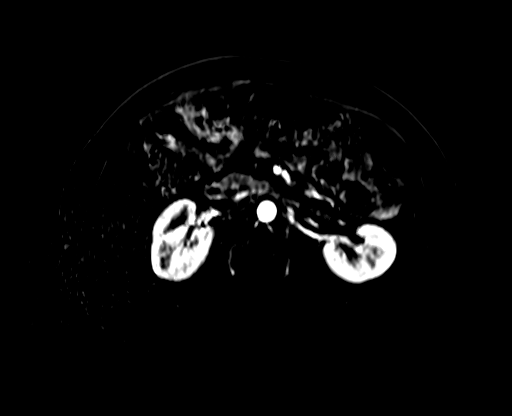
[im 59/88]
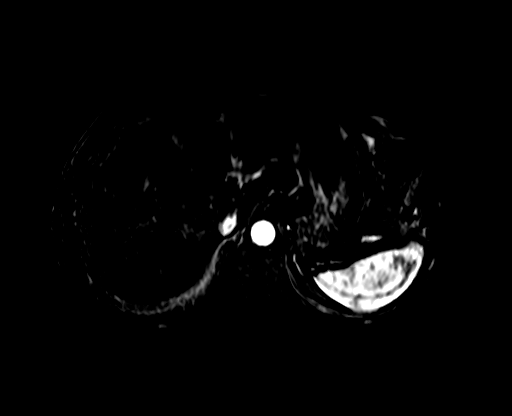
[im 88/88]
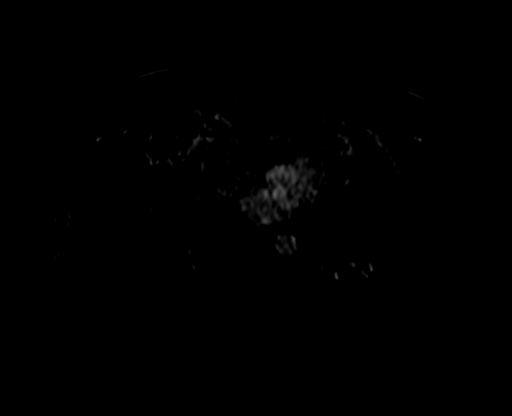

[Series 13: post 45 sec · axial · 2.5mm · 0.78mm/px · z∈[-115,+102]mm · 4 of 88 slices shown]
[im 1/88]
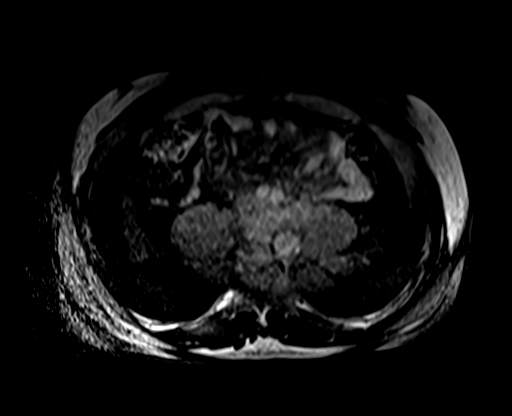
[im 30/88]
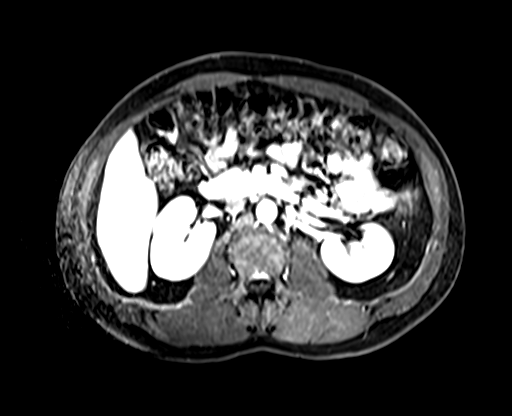
[im 59/88]
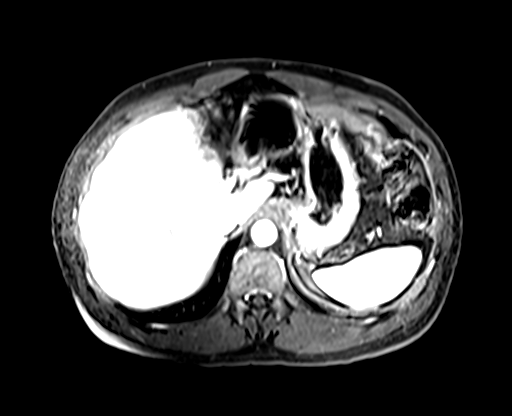
[im 88/88]
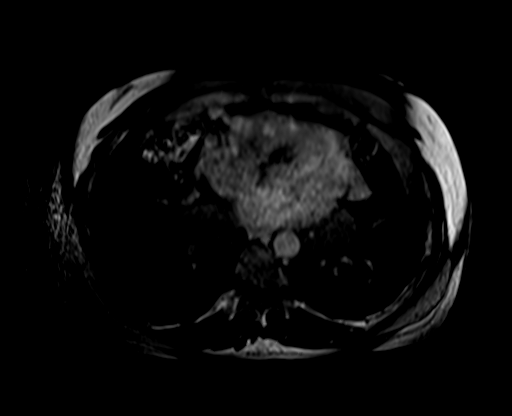

[Series 14: post 45 sec_sub · axial · 2.5mm · 0.78mm/px · z∈[-115,+102]mm · 4 of 88 slices shown]
[im 1/88]
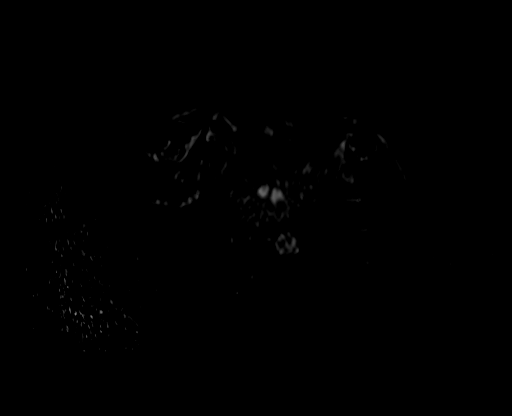
[im 30/88]
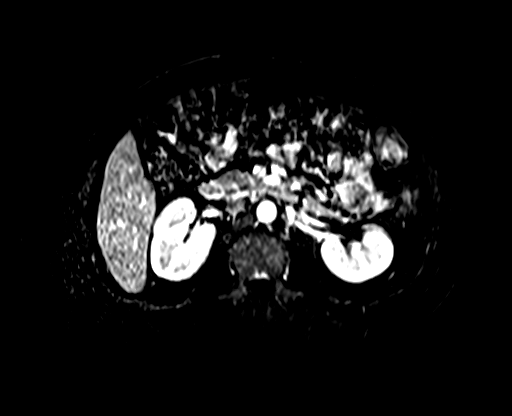
[im 59/88]
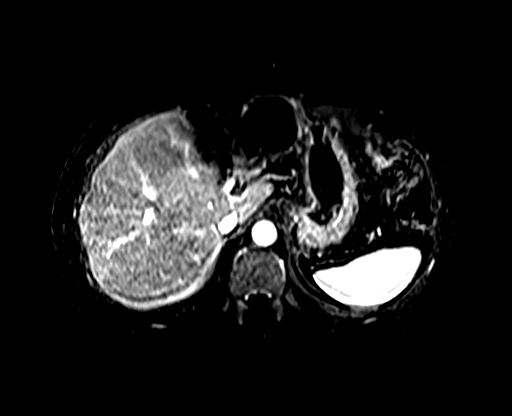
[im 88/88]
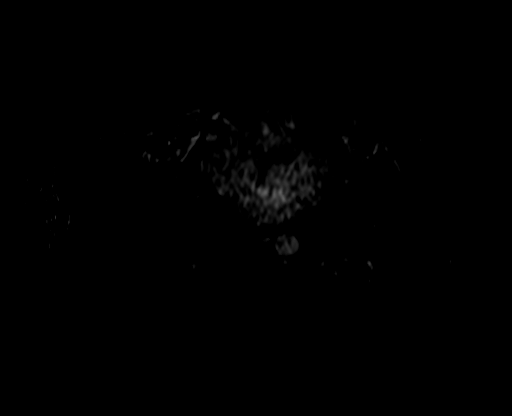

[29 of 48 positions shown; findings below may reference images not displayed]

FINDINGS: Lower chest: Lung bases are clear.

Hepatobiliary: Liver is within normal limits. No
suspicious/enhancing hepatic lesions. No morphologic findings of
cirrhosis. No hepatic steatosis.

Gallbladder is unremarkable. No intrahepatic or extrahepatic ductal
dilatation.

Pancreas:  Within normal limits.

Spleen:  Within normal limits.

Adrenals/Urinary Tract:  Adrenal glands are within normal limits.

6 mm fat density lesion in the right lower kidney (series 9/image
28), compatible with a benign renal angiomyolipoma. 4 mm cyst in the
lateral left lower kidney (series 3/image 12). No suspicious renal
lesions. No hydronephrosis.

Stomach/Bowel: Stomach is within normal limits.

Visualized bowel is unremarkable.

Vascular/Lymphatic:  No evidence of abdominal aortic aneurysm.

No suspicious abdominal lymphadenopathy.

Other:  No abdominal ascites.

Musculoskeletal: No focal osseous lesions.
IMPRESSION: 6 mm benign right lower pole renal angiomyolipoma. 4 mm benign left
lower pole renal cyst (Bosniak I). No suspicious renal lesions.

Liver is within normal limits on MRI.
